# Patient Record
Sex: Female | Born: 1990 | Race: Black or African American | Hispanic: No | Marital: Single | Smoking: Never smoker
Health system: Southern US, Community
[De-identification: ages and names within clinical notes are randomized; demographics above are authoritative.]

## PROBLEM LIST (undated history)

## (undated) DIAGNOSIS — F909 Attention-deficit hyperactivity disorder, unspecified type: Secondary | ICD-10-CM

## (undated) HISTORY — PX: ABDOMINAL SURGERY: SHX537

---

## 2017-12-11 ENCOUNTER — Ambulatory Visit (HOSPITAL_COMMUNITY)
Admission: EM | Admit: 2017-12-11 | Discharge: 2017-12-11 | Disposition: A | Discharge status: Home / Self Care | Payer: BLUE CROSS/BLUE SHIELD | Attending: Family Medicine | Admitting: Family Medicine

## 2017-12-11 ENCOUNTER — Ambulatory Visit (INDEPENDENT_AMBULATORY_CARE_PROVIDER_SITE_OTHER): Payer: BLUE CROSS/BLUE SHIELD

## 2017-12-11 ENCOUNTER — Other Ambulatory Visit: Payer: Self-pay

## 2017-12-11 ENCOUNTER — Encounter (HOSPITAL_COMMUNITY): Payer: Self-pay | Admitting: Emergency Medicine

## 2017-12-11 DIAGNOSIS — R1010 Upper abdominal pain, unspecified: Secondary | ICD-10-CM

## 2017-12-11 DIAGNOSIS — R109 Unspecified abdominal pain: Secondary | ICD-10-CM | POA: Diagnosis present

## 2017-12-11 DIAGNOSIS — F909 Attention-deficit hyperactivity disorder, unspecified type: Secondary | ICD-10-CM | POA: Diagnosis not present

## 2017-12-11 DIAGNOSIS — Z79899 Other long term (current) drug therapy: Secondary | ICD-10-CM | POA: Insufficient documentation

## 2017-12-11 HISTORY — DX: Attention-deficit hyperactivity disorder, unspecified type: F90.9

## 2017-12-11 LAB — CBC
HCT: 35.3 % — ABNORMAL LOW (ref 36.0–46.0)
Hemoglobin: 11.7 g/dL — ABNORMAL LOW (ref 12.0–15.0)
MCH: 31.4 pg (ref 26.0–34.0)
MCHC: 33.1 g/dL (ref 30.0–36.0)
MCV: 94.6 fL (ref 78.0–100.0)
PLATELETS: 175 10*3/uL (ref 150–400)
RBC: 3.73 MIL/uL — AB (ref 3.87–5.11)
RDW: 12.9 % (ref 11.5–15.5)
WBC: 5.4 10*3/uL (ref 4.0–10.5)

## 2017-12-11 LAB — COMPREHENSIVE METABOLIC PANEL
ALT: 62 U/L — AB (ref 14–54)
AST: 145 U/L — AB (ref 15–41)
Albumin: 4 g/dL (ref 3.5–5.0)
Alkaline Phosphatase: 129 U/L — ABNORMAL HIGH (ref 38–126)
Anion gap: 11 (ref 5–15)
BUN: 5 mg/dL — AB (ref 6–20)
CO2: 23 mmol/L (ref 22–32)
CREATININE: 0.59 mg/dL (ref 0.44–1.00)
Calcium: 9.1 mg/dL (ref 8.9–10.3)
Chloride: 103 mmol/L (ref 101–111)
GFR calc Af Amer: >60 mL/min (ref >60)
GFR calc non Af Amer: >60 mL/min (ref >60)
Glucose, Bld: 100 mg/dL — ABNORMAL HIGH (ref 65–99)
POTASSIUM: 3.4 mmol/L — AB (ref 3.5–5.1)
SODIUM: 137 mmol/L (ref 135–145)
Total Bilirubin: 1.9 mg/dL — ABNORMAL HIGH (ref 0.3–1.2)
Total Protein: 6.9 g/dL (ref 6.5–8.1)

## 2017-12-11 LAB — POCT URINALYSIS DIP (DEVICE)
GLUCOSE, UA: 100 mg/dL — AB
Hgb urine dipstick: NEGATIVE
Leukocytes, UA: NEGATIVE
Nitrite: POSITIVE — AB
PH: 5.5 (ref 5.0–8.0)
PROTEIN: 30 mg/dL — AB
Urobilinogen, UA: 1 mg/dL (ref 0.0–1.0)

## 2017-12-11 LAB — LIPASE, BLOOD: LIPASE: 626 U/L — AB (ref 11–51)

## 2017-12-11 MED ORDER — NITROFURANTOIN MONOHYD MACRO 100 MG PO CAPS: 100.0000 mg | ORAL_CAPSULE | Freq: Two times a day (BID) | ORAL | 0 refills | Status: AC

## 2017-12-11 NOTE — Discharge Instructions (Signed)
Your abdominal film is reassuring today with  minimal stool present. I would cute back on the laxatives. May use daily Miralax if needed.  Use of Preparation H as needed for hemorrhoids. May add Zantac at night to help with potential reflux. I will call you if any concerning results from your labs, please log on to your MyChart to visualize these as well.  Please make appointment to follow up with your primary care provider for recheck in the next week. If worsening of symptoms, fevers, increased pain, vomiting, or otherwise worsening please go to Er for further abdominal evaluation.

## 2017-12-11 NOTE — ED Provider Notes (Addendum)
[facility]    CSN: [id number] Arrival date & time: 12/11/17  1610     History   Chief Complaint Chief Complaint  Patient presents with  . Abdominal Pain    appt 4:07    HPI Maria Flynn is a 27 y.o. female.   Maria Flynn presents with complaints of abdominal pain which started two nights ago. States the pain is sharp. Not worse with eating. Decreased appetite, has only eaten Cheese-its today. Has been drinking fluids normally. No fevers. No nausea or vomiting. Has not been able to have a bowel movement. Took dulcolax and strained to have a very small bm today. States prior to it has been three days since BM. Had been having a BM daily. Denies urinary symptoms. Pain is constant and sharp, but worse if lay flat. No back pain. Tried a saline enema this morning with no stool output. Denies any previous similar. States has hemorrhoids which have caused a small amount of bleeding. Has had a superficial abdominal cyst removed but otherwise no previous abdominal surgeries. States drinks wine in moderate amount approximately 3 times a week. Denies any illicit drug use. Does not smoke. LMP two weeks ago. Without contributing medical history.  Takes prevacid and adderall.    ROS per HPI.      Past Medical History:  Diagnosis Date  . ADHD     There are no active problems to display for this patient.   Past Surgical History:  Procedure Laterality Date  . ABDOMINAL SURGERY      OB History   None      Home Medications    Prior to Admission medications   Medication Sig Start Date End Date Taking? Authorizing Provider  Amphetamine-Dextroamphetamine (ADDERALL PO) Take by mouth.   Yes [provider]  lansoprazole (PREVACID) 15 MG capsule Take 15 mg by mouth daily at 12 noon.   Yes [provider]    Family History Family History  Problem Relation Age of Onset  . Healthy Mother   . Healthy Father     Social History Social History   Tobacco Use   . Smoking status: Never Smoker  Substance Use Topics  . Alcohol use: Yes  . Drug use: Never     Allergies   Patient has no known allergies.   Review of Systems Review of Systems   Physical Exam Triage Vital Signs ED Triage Vitals  Enc Vitals Group     BP 12/11/17 1634 123/87     Pulse Rate 12/11/17 1634 100     Resp 12/11/17 1634 16     Temp 12/11/17 1634 98.2 F (36.8 C)     Temp Source 12/11/17 1634 Oral     SpO2 12/11/17 1634 100 %     Weight --      Height --      Head Circumference --      Peak Flow --      Pain Score 12/11/17 1630 9     Pain Loc --      Pain Edu? --      Excl. in GC? --    No data found.  Updated Vital Signs BP 123/87 (BP Location: Right Arm)   Pulse 100   Temp 98.2 F (36.8 C) (Oral)   Resp 16   LMP 10/23/2017   SpO2 100%    Physical Exam  Constitutional: She is oriented to person, place, and time. She appears well-developed and well-nourished. No distress.  Cardiovascular: Normal  rate, regular rhythm and normal heart sounds.  Pulmonary/Chest: Effort normal and breath sounds normal.  Abdominal: Soft. Bowel sounds are increased. There is no hepatosplenomegaly, splenomegaly or hepatomegaly. There is tenderness in the right upper quadrant, epigastric area, periumbilical area and left upper quadrant. There is no rigidity, no rebound, no guarding, no CVA tenderness, no tenderness at McBurney's point and negative Murphy's sign. No hernia.  Upper abdomen with mild bloating and tenderness   Neurological: She is alert and oriented to person, place, and time.  Skin: Skin is warm and dry.     UC Treatments / Results  Labs (all labs ordered are listed, but only abnormal results are displayed) Labs Reviewed  CBC  COMPREHENSIVE METABOLIC PANEL  LIPASE, BLOOD  URINE CYTOLOGY ANCILLARY ONLY    EKG None  Radiology Dg Abd 1 View  Result Date: 12/11/2017 CLINICAL DATA:  Constipation, abdominal pain EXAM: ABDOMEN - 1 VIEW COMPARISON:   None FINDINGS: Normal bowel gas pattern. Minimal retained stool. No bowel dilatation or bowel wall thickening. Osseous structures unremarkable. No definite urinary tract calcifications. IMPRESSION: No acute abnormalities. Electronically Signed   By: Ulyses Southward M.D.   On: 12/11/2017 17:05    Procedures Procedures (including critical care time)  Medications Ordered in UC Medications - No data to display  Initial Impression / Assessment and Plan / UC Course  I have reviewed the triage vital signs and the nursing notes.  Pertinent labs & imaging results that were available during my care of the patient were reviewed by me and considered in my medical decision making (see chart for details).     On chart review patient HR appears to consistently be in low 100's, no change today from baseline. Three days of constipation and straining to pass BM. No fevers. No nausea or vomiting. Still taking liquids and some eating. KUB without significant stool, normal gas pattern. Pain is to upper abdomen. Hx of reflux, states usually with reflux does get more chest burning. Question gastritis vs gastric ulcer presence. On prevacid. Encouraged to add nightly zantac as well. CBC, CMP, lipase and urine pending. Will notify patient of any concerning or abnormal findings. Encourage close follow up with PCP in the next few days, or if any worsening to go to ER for further evaluation. Patient drinking fluids without difficulty in room. Ambulatory and non toxic appearing. Patient verbalized understanding and agreeable to plan.     Urine with nitrite, was very concentrated with trace ketones only. No hgb and no leuks. States she has had UTI's in the past quite frequently. Will treat with macrobid pending urine culture. Patient verbalized understanding.  Final Clinical Impressions(s) / UC Diagnoses   Final diagnoses:  Pain of upper abdomen     Discharge Instructions     Your abdominal film is reassuring today with   minimal stool present. I would cute back on the laxatives. May use daily Miralax if needed.  Use of Preparation H as needed for hemorrhoids. May add Zantac at night to help with potential reflux. I will call you if any concerning results from your labs, please log on to your MyChart to visualize these as well.  Please make appointment to follow up with your primary care provider for recheck in the next week. If worsening of symptoms, fevers, increased pain, vomiting, or otherwise worsening please go to Er for further abdominal evaluation.     ED Prescriptions    None     Controlled Substance Prescriptions [place] Controlled Substance  Registry consulted? Not Applicable   Georgetta Haber, NP 12/11/17 1724    Georgetta Haber, NP 12/11/17 1725    Georgetta Haber, NP 12/11/17 1749

## 2017-12-11 NOTE — ED Notes (Signed)
Bed: UC01 Expected date:  Expected time:  Means of arrival:  Comments: 

## 2017-12-11 NOTE — ED Triage Notes (Signed)
Abdominal pain for 2 days.  Lat bm was 2 hours ago and required a laxative.  Hard balls of stool in diarrhea per patient

## 2017-12-13 LAB — URINE CULTURE: CULTURE: NO GROWTH

## 2017-12-15 LAB — URINE CYTOLOGY ANCILLARY ONLY
CANDIDA VAGINITIS: NEGATIVE
Chlamydia: NEGATIVE
Neisseria Gonorrhea: NEGATIVE
TRICH (WINDOWPATH): NEGATIVE

## 2017-12-16 ENCOUNTER — Telehealth (HOSPITAL_COMMUNITY): Payer: Self-pay

## 2017-12-16 NOTE — Telephone Encounter (Signed)
Bacterial vaginosis is positive. This was not treated at the urgent care visit. Patient reports feeling better with no symptoms.  Pt called and made aware of results and new prescription. Answered all questions and pt verbalized understanding.

## 2018-03-23 ENCOUNTER — Emergency Department (HOSPITAL_COMMUNITY): Payer: BLUE CROSS/BLUE SHIELD

## 2018-03-23 ENCOUNTER — Encounter (HOSPITAL_COMMUNITY): Payer: Self-pay | Admitting: Emergency Medicine

## 2018-03-23 ENCOUNTER — Emergency Department (HOSPITAL_COMMUNITY)
Admission: EM | Admit: 2018-03-23 | Discharge: 2018-03-24 | Disposition: A | Discharge status: Home / Self Care | Payer: BLUE CROSS/BLUE SHIELD | Attending: Emergency Medicine | Admitting: Emergency Medicine

## 2018-03-23 DIAGNOSIS — F909 Attention-deficit hyperactivity disorder, unspecified type: Secondary | ICD-10-CM | POA: Insufficient documentation

## 2018-03-23 DIAGNOSIS — K852 Alcohol induced acute pancreatitis without necrosis or infection: Secondary | ICD-10-CM | POA: Insufficient documentation

## 2018-03-23 DIAGNOSIS — R1084 Generalized abdominal pain: Secondary | ICD-10-CM | POA: Diagnosis present

## 2018-03-23 DIAGNOSIS — Z79899 Other long term (current) drug therapy: Secondary | ICD-10-CM | POA: Insufficient documentation

## 2018-03-23 LAB — COMPREHENSIVE METABOLIC PANEL
ALBUMIN: 3.6 g/dL (ref 3.5–5.0)
ALK PHOS: 115 U/L (ref 38–126)
ALT: 70 U/L — ABNORMAL HIGH (ref 0–44)
ANION GAP: 10 (ref 5–15)
AST: 268 U/L — ABNORMAL HIGH (ref 15–41)
BUN: <5 mg/dL — ABNORMAL LOW (ref 6–20)
CALCIUM: 8.8 mg/dL — AB (ref 8.9–10.3)
CHLORIDE: 105 mmol/L (ref 98–111)
CO2: 24 mmol/L (ref 22–32)
Creatinine, Ser: 0.54 mg/dL (ref 0.44–1.00)
GFR calc Af Amer: >60 mL/min (ref >60)
GFR calc non Af Amer: >60 mL/min (ref >60)
GLUCOSE: 132 mg/dL — AB (ref 70–99)
Potassium: 3.8 mmol/L (ref 3.5–5.1)
Sodium: 139 mmol/L (ref 135–145)
Total Bilirubin: 1 mg/dL (ref 0.3–1.2)
Total Protein: 6.7 g/dL (ref 6.5–8.1)

## 2018-03-23 LAB — TYPE AND SCREEN
ABO/RH(D): A NEG
ANTIBODY SCREEN: NEGATIVE

## 2018-03-23 LAB — LIPASE, BLOOD: LIPASE: 80 U/L — AB (ref 11–51)

## 2018-03-23 LAB — I-STAT BETA HCG BLOOD, ED (MC, WL, AP ONLY)

## 2018-03-23 LAB — CBC
HEMATOCRIT: 33.3 % — AB (ref 36.0–46.0)
HEMOGLOBIN: 10.7 g/dL — AB (ref 12.0–15.0)
MCH: 29.5 pg (ref 26.0–34.0)
MCHC: 32.1 g/dL (ref 30.0–36.0)
MCV: 91.7 fL (ref 78.0–100.0)
Platelets: 215 10*3/uL (ref 150–400)
RBC: 3.63 MIL/uL — ABNORMAL LOW (ref 3.87–5.11)
RDW: 16.1 % — ABNORMAL HIGH (ref 11.5–15.5)
WBC: 5.3 10*3/uL (ref 4.0–10.5)

## 2018-03-23 LAB — ABO/RH: ABO/RH(D): A NEG

## 2018-03-23 MED ORDER — SODIUM CHLORIDE 0.9 % IV BOLUS
1000.0000 mL | Freq: Once | INTRAVENOUS | Status: AC
  Administered 2018-03-23: 1000 mL via INTRAVENOUS

## 2018-03-23 MED ORDER — ONDANSETRON 4 MG PO TBDP
4.0000 mg | ORAL_TABLET | Freq: Once | ORAL | Status: AC | PRN
  Administered 2018-03-23: 4 mg via ORAL
  Filled 2018-03-23: qty 1

## 2018-03-23 MED ORDER — ONDANSETRON HCL 4 MG/2ML IJ SOLN
4.0000 mg | Freq: Once | INTRAMUSCULAR | Status: AC
Start: 2018-03-23 — End: 2018-03-23
  Administered 2018-03-23: 4 mg via INTRAVENOUS
  Filled 2018-03-23: qty 2

## 2018-03-23 MED ORDER — HYDROMORPHONE HCL 1 MG/ML IJ SOLN
1.0000 mg | Freq: Once | INTRAMUSCULAR | Status: AC
  Administered 2018-03-23: 1 mg via INTRAVENOUS
  Filled 2018-03-23: qty 1

## 2018-03-23 MED ORDER — KETOROLAC TROMETHAMINE 30 MG/ML IJ SOLN
30.0000 mg | Freq: Once | INTRAMUSCULAR | Status: AC
  Administered 2018-03-24: 30 mg via INTRAVENOUS
  Filled 2018-03-23: qty 1

## 2018-03-23 MED ORDER — SODIUM CHLORIDE 0.9 % IV BOLUS
1000.0000 mL | Freq: Once | INTRAVENOUS | Status: AC
  Administered 2018-03-24: 1000 mL via INTRAVENOUS

## 2018-03-23 NOTE — ED Provider Notes (Signed)
[facility] EMERGENCY DEPARTMENT Provider Note   CSN: [id number] Arrival date & time: 03/23/18  1922     History   Chief Complaint Chief Complaint  Patient presents with  . Shortness of Breath  . Abdominal Pain  . Hematemesis    HPI Maria Flynn is a 27 y.o. female.  Patient presents to the emergency department with a chief complaint of generalized abdominal pain.  She states pain started around 5 PM today.  She reports a history of alcohol abuse, and states that she does still go on drinking binges.  She reports severe abdominal pain.  Also reports a history of pancreatitis.  She denies any prior abdominal surgeries.  Denies any fevers or chills.  She states that she has had nausea with vomiting.  She denies any blood in her stools, and states that she has had decreased bowel movements today.  She states that her pain is moderate to severe.  She reports having had pain like this before when she had pancreatitis.  The history is provided by the patient. No language interpreter was used.    Past Medical History:  Diagnosis Date  . ADHD     There are no active problems to display for this patient.   Past Surgical History:  Procedure Laterality Date  . ABDOMINAL SURGERY       OB History   None      Home Medications    Prior to Admission medications   Medication Sig Start Date End Date Taking? Authorizing Provider  Amphetamine-Dextroamphetamine (ADDERALL PO) Take by mouth.    [provider]  lansoprazole (PREVACID) 15 MG capsule Take 15 mg by mouth daily at 12 noon.    [provider]    Family History Family History  Problem Relation Age of Onset  . Healthy Mother   . Healthy Father     Social History Social History   Tobacco Use  . Smoking status: Never Smoker  Substance Use Topics  . Alcohol use: Yes    Comment: every day including today 9/30//19  . Drug use: Never     Allergies   Patient has no known  allergies.   Review of Systems Review of Systems  All other systems reviewed and are negative.    Physical Exam Updated Vital Signs BP 120/90 (BP Location: Right Arm)   Pulse 83   Temp 97.9 F (36.6 C) (Oral)   Resp 18   Ht 5\' 7"  (1.702 m)   Wt 53.5 kg   SpO2 100%   BMI 18.48 kg/m   Physical Exam  Constitutional: She is oriented to person, place, and time. She appears well-developed and well-nourished.  HENT:  Head: Normocephalic and atraumatic.  Eyes: Pupils are equal, round, and reactive to light. Conjunctivae and EOM are normal.  Neck: Normal range of motion. Neck supple.  Cardiovascular: Normal rate and regular rhythm. Exam reveals no gallop and no friction rub.  No murmur heard. Pulmonary/Chest: Effort normal and breath sounds normal. No respiratory distress. She has no wheezes. She has no rales. She exhibits no tenderness.  Abdominal: Soft. Bowel sounds are normal. She exhibits no distension and no mass. There is tenderness. There is no rebound and no guarding.  She is abdominal discomfort, significant right upper abdominal tenderness  Musculoskeletal: Normal range of motion. She exhibits no edema or tenderness.  Neurological: She is alert and oriented to person, place, and time.  Skin: Skin is warm and dry.  Psychiatric: She has  a normal mood and affect. Her behavior is normal. Judgment and thought content normal.  Nursing note and vitals reviewed.    ED Treatments / Results  Labs (all labs ordered are listed, but only abnormal results are displayed) Labs Reviewed  LIPASE, BLOOD - Abnormal; Notable for the following components:      Result Value   Lipase 80 (*)    All other components within normal limits  COMPREHENSIVE METABOLIC PANEL - Abnormal; Notable for the following components:   Glucose, Bld 132 (*)    BUN <5 (*)    Calcium 8.8 (*)    AST 268 (*)    ALT 70 (*)    All other components within normal limits  CBC - Abnormal; Notable for the following  components:   RBC 3.63 (*)    Hemoglobin 10.7 (*)    HCT 33.3 (*)    RDW 16.1 (*)    All other components within normal limits  I-STAT BETA HCG BLOOD, ED ([facility], [facility], [facility] ONLY)  I-STAT BETA HCG BLOOD, ED ([facility], [facility], [facility] ONLY)  TYPE AND SCREEN  ABO/RH    EKG None  Radiology US Abdomen Limited  Result Date: 03/23/2018 CLINICAL DATA:  Right upper quadrant abdominal pain, nausea/vomiting, elevated lipase. History of pancreatitis. EXAM: ULTRASOUND ABDOMEN LIMITED RIGHT UPPER QUADRANT COMPARISON:  None. FINDINGS: Gallbladder: No gallstones, gallbladder wall thickening, or pericholecystic fluid. Mild layering gallbladder sludge. Negative sonographic Murphy's sign. Common bile duct: Diameter: 4 mm Liver: Hyperechoic hepatic parenchyma, suggesting hepatic steatosis. No focal hepatic lesion is seen. Portal vein is patent on color Doppler imaging with normal direction of blood flow towards the liver. IMPRESSION: Hyperechoic hepatic parenchyma, suggesting hepatic steatosis. No focal hepatic lesion is seen. Mild layering gallbladder sludge, without associated inflammatory changes. Electronically Signed   By: Charline Bills M.D.   On: 03/23/2018 23:31    Procedures Procedures (including critical care time)  Medications Ordered in ED Medications  HYDROmorphone (DILAUDID) injection 1 mg (has no administration in time range)  ondansetron (ZOFRAN) injection 4 mg (has no administration in time range)  sodium chloride 0.9 % bolus 1,000 mL (has no administration in time range)  ondansetron (ZOFRAN-ODT) disintegrating tablet 4 mg (4 mg Oral Given 03/23/18 1941)     Initial Impression / Assessment and Plan / ED Course  I have reviewed the triage vital signs and the nursing notes.  Pertinent labs & imaging results that were available during my care of the patient were reviewed by me and considered in my medical decision making (see chart for details).    With generalized abdominal pain.  She is tender in the  right upper quadrant.  She has a history of alcoholic pancreatitis.  Her LFTs were also noted to be significantly elevated today.  Will check right upper quadrant ultrasound.  Vital signs are stable.  We will treat pain, give fluids, and will reassess.  Ultrasound shows hepatic steatosis and gallbladder sludge.  I discussed these findings with the patient.  Advised cessation of alcohol use.  We will continue to give fluids and manage pain.  Will reevaluate.  Patient reports feeling improved.  She is tolerating oral intake.  Discussed observation admission versus discharge.  Patient is agreeable with discharge at this time.  Will return for worsening symptoms.  Offered Vicodin or Percocet, patient declined this, and requested ibuprofen 800 mg.  Recommend continuing fluids.  Final Clinical Impressions(s) / ED Diagnoses   Final diagnoses:  Alcohol-induced acute pancreatitis, unspecified complication status  ED Discharge Orders         Ordered    ondansetron (ZOFRAN ODT) 4 MG disintegrating tablet  Every 8 hours PRN     03/24/18 0202    ibuprofen (ADVIL,MOTRIN) 800 MG tablet  3 times daily     03/24/18 0202           Roxy Horseman, PA-C 03/24/18 0206    Nira Conn, MD 03/24/18 [phone]

## 2018-03-23 NOTE — ED Triage Notes (Signed)
Pt presents with abd pain and sob that she says feels just like her pancreatitis pain; began vomiting blood tinged sputum/emesis today; pt appears pale in triage

## 2018-03-23 NOTE — ED Notes (Signed)
Blood bank called and advised immunoglobulin is available if needed.

## 2018-03-23 NOTE — ED Notes (Signed)
Patient transported to US 

## 2018-03-24 MED ORDER — IBUPROFEN 800 MG PO TABS
800.0000 mg | ORAL_TABLET | Freq: Once | ORAL | Status: AC
  Administered 2018-03-24: 800 mg via ORAL
  Filled 2018-03-24: qty 1

## 2018-03-24 MED ORDER — IBUPROFEN 800 MG PO TABS: 800.0000 mg | ORAL_TABLET | Freq: Three times a day (TID) | ORAL | 0 refills | Status: AC

## 2018-03-24 MED ORDER — ONDANSETRON 4 MG PO TBDP: 4.0000 mg | ORAL_TABLET | Freq: Three times a day (TID) | ORAL | 0 refills | Status: AC | PRN

## 2018-03-24 MED ORDER — MORPHINE SULFATE (PF) 4 MG/ML IV SOLN
4.0000 mg | Freq: Once | INTRAVENOUS | Status: AC
  Administered 2018-03-24: 4 mg via INTRAVENOUS
  Filled 2018-03-24: qty 1

## 2018-03-24 NOTE — ED Notes (Signed)
Pt reports she is feeling much better then when she first came in. Pt reports now nausea after drinking of water.

## 2018-03-24 NOTE — ED Notes (Signed)
Patient verbalizes understanding of discharge instructions. Opportunity for questioning and answers were provided. Armband removed by staff, pt discharged from ED to home via POV  

## 2020-02-23 IMAGING — US US ABDOMEN LIMITED
1 series · 14 of 25 positions shown · non-contrast
Comparison: None.

CLINICAL DATA: Right upper quadrant abdominal pain,
nausea/vomiting, elevated lipase. History of pancreatitis.

EXAM:
ULTRASOUND ABDOMEN LIMITED RIGHT UPPER QUADRANT

[Series 1: us abdomen limited · 0.20mm/px · 14 of 49 slices shown]
[im 1/49]
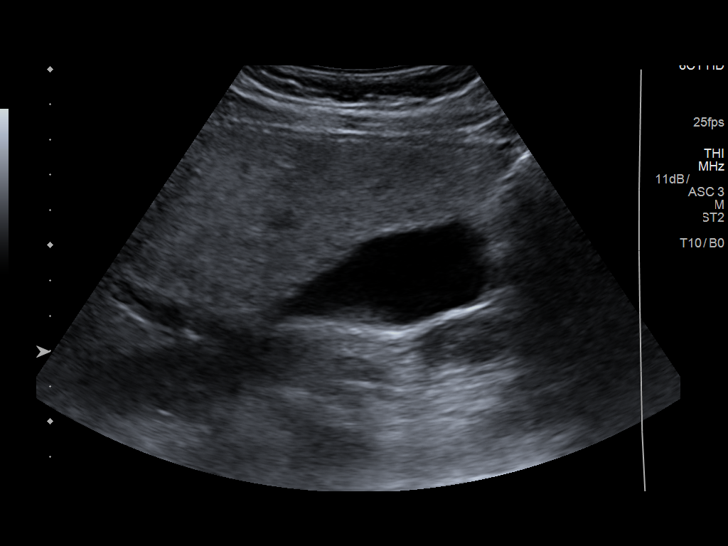
[im 5/49]
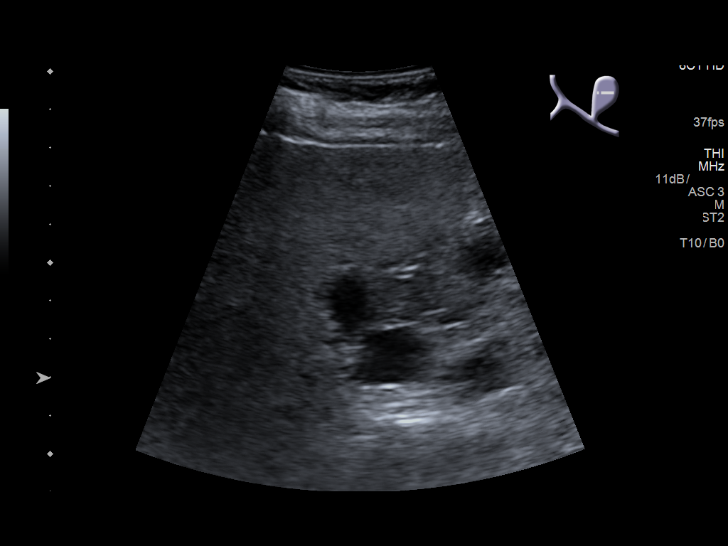
[im 9/49]
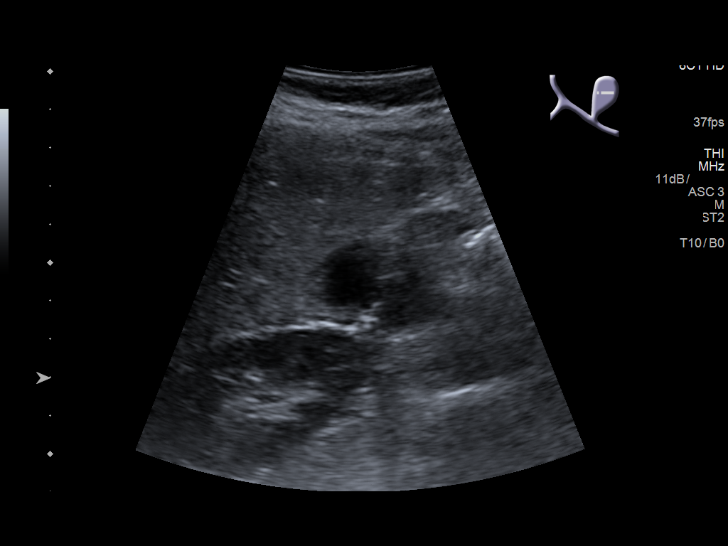
[im 13/49]
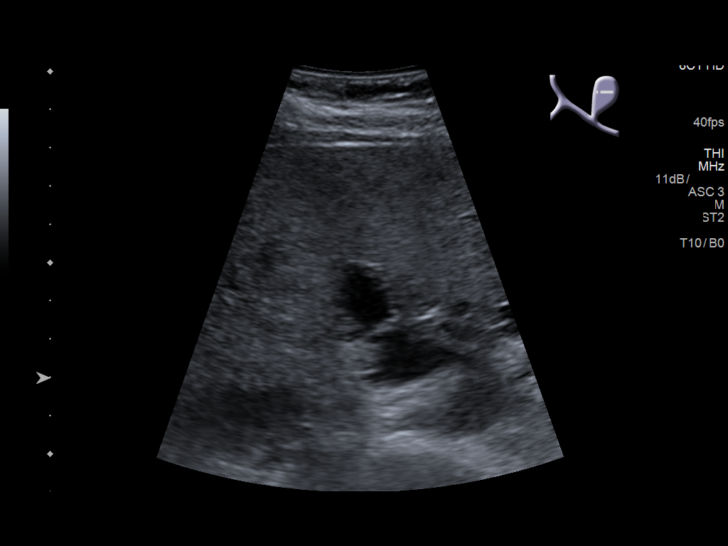
[im 17/49]
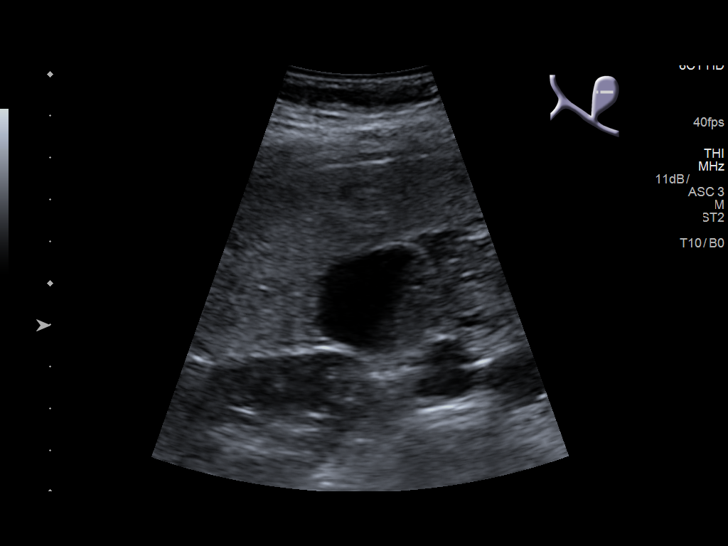
[im 19/49]
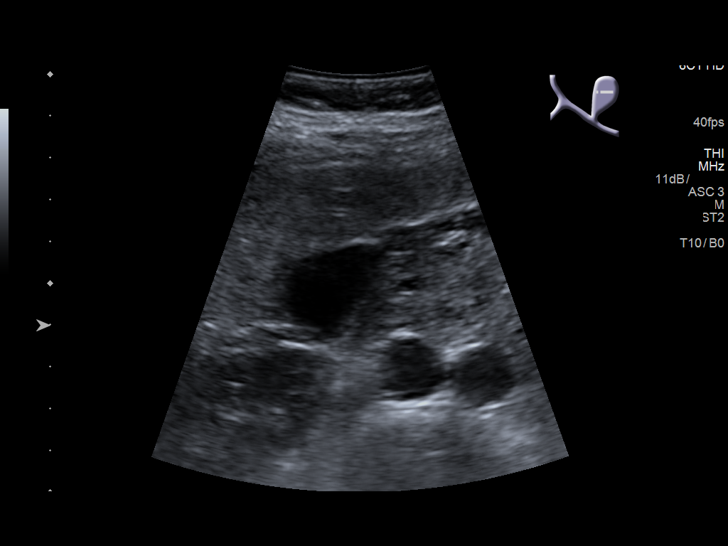
[im 23/49]
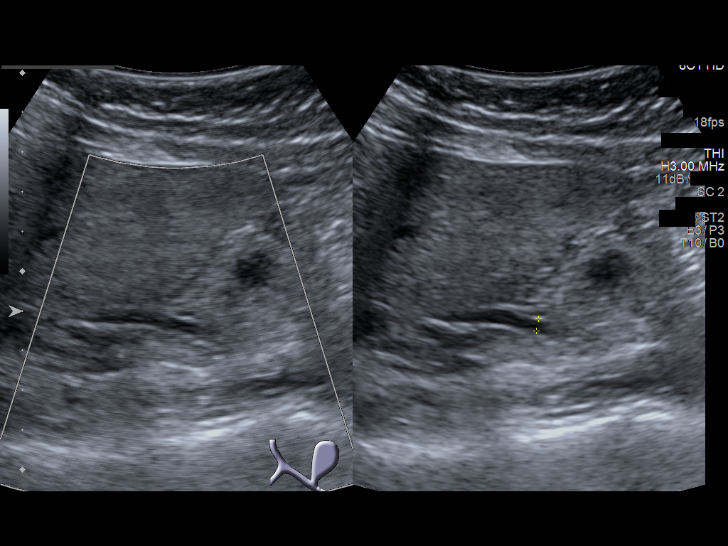
[im 27/49]
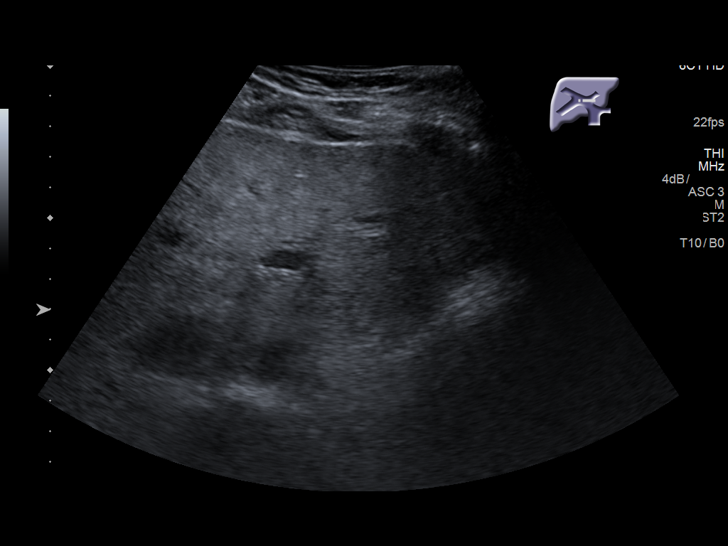
[im 31/49]
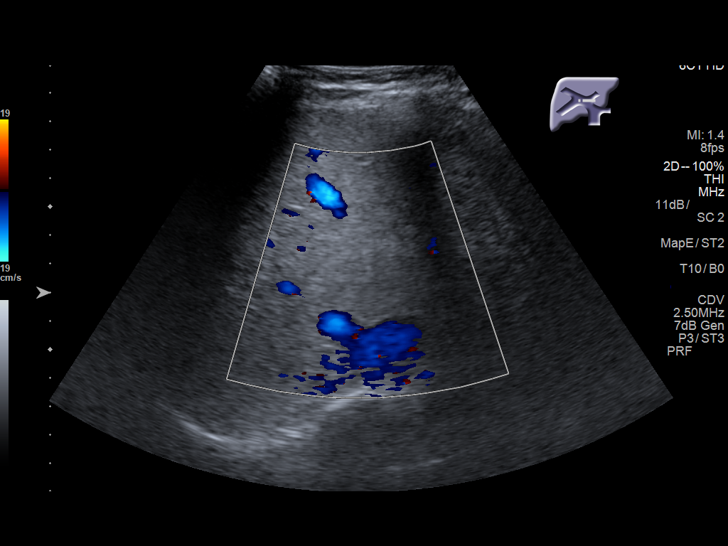
[im 33/49]
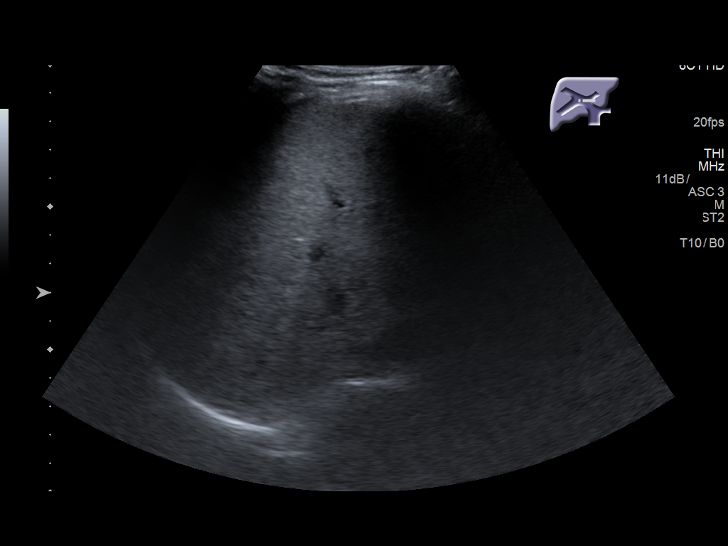
[im 37/49]
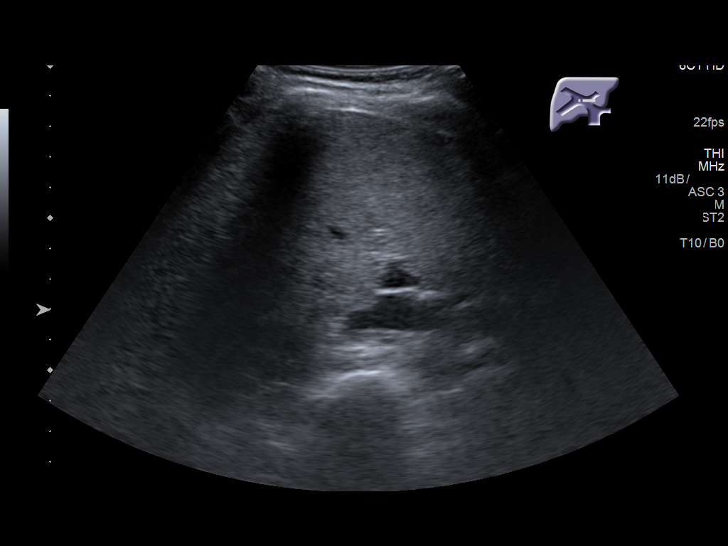
[im 41/49]
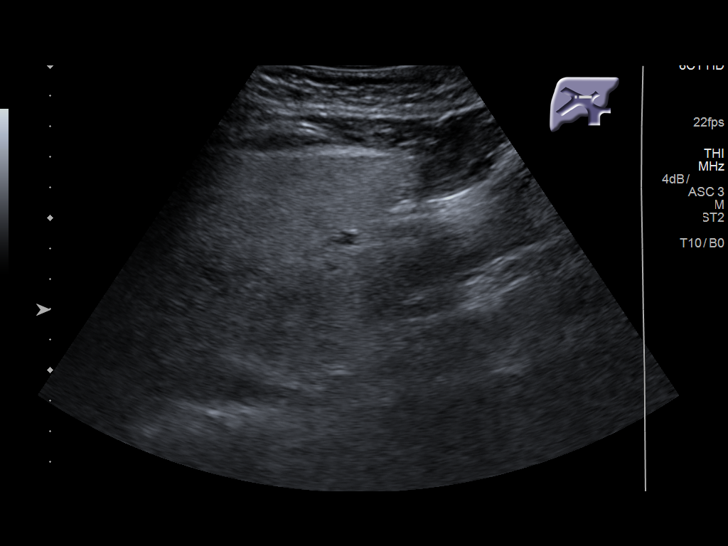
[im 45/49]
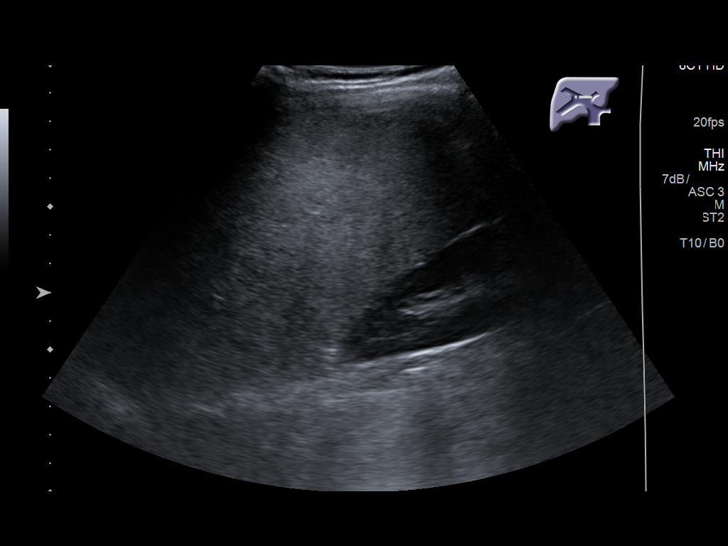
[im 49/49]
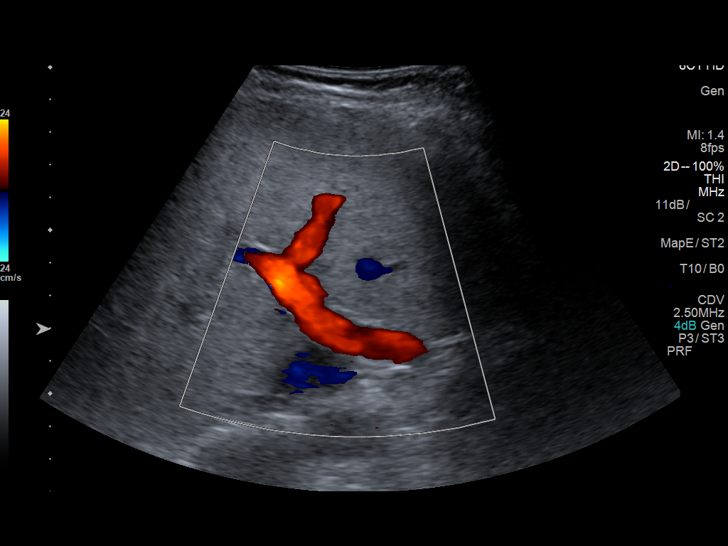

[14 of 25 positions shown; findings below may reference images not displayed]

FINDINGS: Gallbladder:

No gallstones, gallbladder wall thickening, or pericholecystic
fluid. Mild layering gallbladder sludge. Negative sonographic
Murphy's sign.

Common bile duct:

Diameter: 4 mm

Liver:

Hyperechoic hepatic parenchyma, suggesting hepatic steatosis. No
focal hepatic lesion is seen. Portal vein is patent on color Doppler
imaging with normal direction of blood flow towards the liver.
IMPRESSION: Hyperechoic hepatic parenchyma, suggesting hepatic steatosis. No
focal hepatic lesion is seen.

Mild layering gallbladder sludge, without associated inflammatory
changes.
